# Patient Record
Sex: Male | Born: 1995 | Hispanic: No | Marital: Single | Smoking: Never smoker
Health system: Southern US, Community
[De-identification: ages and names within clinical notes are randomized; demographics above are authoritative.]

---
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
## 2016-10-16 ENCOUNTER — Emergency Department: Payer: Self-pay

## 2016-10-16 ENCOUNTER — Encounter: Payer: Self-pay | Admitting: Emergency Medicine

## 2016-10-16 DIAGNOSIS — Y929 Unspecified place or not applicable: Secondary | ICD-10-CM | POA: Insufficient documentation

## 2016-10-16 DIAGNOSIS — W231XXA Caught, crushed, jammed, or pinched between stationary objects, initial encounter: Secondary | ICD-10-CM | POA: Insufficient documentation

## 2016-10-16 DIAGNOSIS — Y999 Unspecified external cause status: Secondary | ICD-10-CM | POA: Insufficient documentation

## 2016-10-16 DIAGNOSIS — S60111A Contusion of right thumb with damage to nail, initial encounter: Secondary | ICD-10-CM | POA: Insufficient documentation

## 2016-10-16 DIAGNOSIS — Y9389 Activity, other specified: Secondary | ICD-10-CM | POA: Insufficient documentation

## 2016-10-16 NOTE — ED Triage Notes (Signed)
Patient ambulatory to triage with complaints of right thumb pain after slamming thumb in car door at approx 1800, redness, pain and swelling to area. Pt reports taking IBU  at 2300 before coming to ED.  Speaking in complete coherent sentences. No acute breathing distress noted.

## 2016-10-17 ENCOUNTER — Emergency Department
Admission: EM | Admit: 2016-10-17 | Discharge: 2016-10-17 | Disposition: A | Discharge status: Home / Self Care | Payer: Self-pay | Attending: Emergency Medicine | Admitting: Emergency Medicine

## 2016-10-17 DIAGNOSIS — S60111A Contusion of right thumb with damage to nail, initial encounter: Secondary | ICD-10-CM

## 2016-10-17 MED ORDER — BACITRACIN ZINC 500 UNIT/GM EX OINT
TOPICAL_OINTMENT | Freq: Once | CUTANEOUS | Status: AC
  Administered 2016-10-17: 1 via TOPICAL
  Filled 2016-10-17: qty 0.9

## 2016-10-17 MED ORDER — IBUPROFEN 400 MG PO TABS
400.0000 mg | ORAL_TABLET | Freq: Once | ORAL | Status: AC
  Administered 2016-10-17: 400 mg via ORAL
  Filled 2016-10-17: qty 1

## 2016-10-17 MED ORDER — ACETAMINOPHEN 325 MG PO TABS
650.0000 mg | ORAL_TABLET | Freq: Once | ORAL | Status: AC
  Administered 2016-10-17: 650 mg via ORAL
  Filled 2016-10-17: qty 2

## 2016-10-17 NOTE — ED Provider Notes (Signed)
[facility] Emergency Department Provider Note   ____________________________________________   First MD Initiated Contact with Patient 10/17/16 0103     (approximate)  I have reviewed the triage vital signs and the nursing notes.   HISTORY  Chief Complaint Finger Injury    HPI Eric Hart is a 21 y.o. male who comes into the hospital today with some pain to his right thumb. He reports that he's not sure if it's just bruised or broken. He reports he was getting out of the car to pump gas and when he slammed the door his finger was still there. The patient slammed his finger in the door. He took some ibuprofen at home as well as ice to it but it didn't help with the pain. He tried to sleep but the pain was too intense that he decided to come in to the hospital for evaluation. The patient rates his pain a 6-7 out of 10 in intensity. He is able to move it but again reports it is throbbing and pain.   History reviewed. No pertinent past medical history.  There are no active problems to display for this patient.   History reviewed. No pertinent surgical history.  Prior to Admission medications   Not on File    Allergies Patient has no known allergies.  History reviewed. No pertinent family history.  Social History Social History  Substance Use Topics  . Smoking status: Never Smoker  . Smokeless tobacco: Never Used  . Alcohol use No    Review of Systems Constitutional: No fever/chills Eyes: No visual changes. ENT: No sore throat. Cardiovascular: Denies chest pain. Respiratory: Denies shortness of breath. Gastrointestinal: No abdominal pain.  No nausea, no vomiting.  No diarrhea.  No constipation. Genitourinary: Negative for dysuria. Musculoskeletal: Right thumb pain Skin: Negative for rash. Neurological: Negative for headaches, focal weakness or numbness.  10-point ROS otherwise  negative.  ____________________________________________   PHYSICAL EXAM:  VITAL SIGNS: ED Triage Vitals  Enc Vitals Group     BP 10/16/16 2334 123/75     Pulse Rate 10/16/16 2334 75     Resp 10/16/16 2334 18     Temp 10/16/16 2334 98.3 F (36.8 C)     Temp Source 10/16/16 2334 Oral     SpO2 10/16/16 2334 98 %     Weight 10/16/16 2335 120 lb (54.4 kg)     Height 10/16/16 2335  (1.651 m)     Head Circumference --      Peak Flow --      Pain Score 10/16/16 2334 7     Pain Loc --      Pain Edu? --      Excl. in GC? --     Constitutional: Alert and oriented. Well appearing and in Moderate distress. Eyes: Conjunctivae are normal. PERRL. EOMI. Head: Atraumatic. Nose: No congestion/rhinnorhea. Mouth/Throat: Mucous membranes are moist.  Oropharynx non-erythematous. Cardiovascular: Normal rate, regular rhythm. Grossly normal heart sounds.  Good peripheral circulation. Respiratory: Normal respiratory effort.  No retractions. Lungs CTAB. Gastrointestinal: Soft and nontender. No distention. Positive bowel sounds Musculoskeletal: No lower extremity tenderness nor edema.  Neurologic:  Normal speech and language.  Skin:  Skin is warm, dry and intact. Patient has a hematoma to the base of the right thumbnail Psychiatric: Mood and affect are normal.   ____________________________________________   LABS (all labs ordered are listed, but only abnormal results are displayed)  Labs Reviewed - No data to display ____________________________________________  EKG  none ____________________________________________  RADIOLOGY  DG right thumb ____________________________________________   PROCEDURES  Procedure(s) performed: please, see procedure note(s).  .Nail Removal Date/Time: 10/17/2016 1:10 AM Performed by: Rebecka Apley Authorized by: Rebecka Apley   Consent:    Consent obtained:  Verbal Location:    Hand:  R thumb Pre-procedure details:    Skin  preparation:  Alcohol Trephination:    Subungual hematoma drained: yes     Trephination instrument:  Needle Post-procedure details:    Dressing:  4x4 sterile gauze and antibiotic ointment   Patient tolerance of procedure:  Tolerated well, no immediate complications    Critical Care performed: No  ____________________________________________   INITIAL IMPRESSION / ASSESSMENT AND PLAN / ED COURSE  Pertinent labs & imaging results that were available during my care of the patient were reviewed by me and considered in my medical decision making (see chart for details).  This is a 21 year old male who comes into the hospital today after slamming his right thumb into the car door. The patient had an x-ray which did not show a fracture but the patient does have a subungual hematoma. I did trephinate the patient's nail to release the hematoma. I will also give him some Tylenol and ibuprofen. We'll wrap the patient's finger and some gauze and he will be discharged home. I think her symptoms ice it and to follow-up. The patient has no further questions or concerns at this time and he'll be discharged home.  Clinical Course as of Oct 17 137  Mon Oct 17, 2016  0102 Negative. DG Finger Thumb Right [AW]    Clinical Course User Index [AW] Rebecka Apley, MD     ____________________________________________   FINAL CLINICAL IMPRESSION(S) / ED DIAGNOSES  Final diagnoses:  Subungual hematoma of right thumb, initial encounter      NEW MEDICATIONS STARTED DURING THIS VISIT:  New Prescriptions   No medications on file     Note:  This document was prepared using Dragon voice recognition software and may include unintentional dictation errors.    Rebecka Apley, MD 10/17/16 [phone]

## 2017-12-21 IMAGING — CR DG FINGER THUMB 2+V*R*
3 series · 3 of 3 positions shown · non-contrast
Comparison: None.

CLINICAL DATA: Redness, pain, and swelling to the right thumb after
slamming in a car door.

EXAM:
RIGHT THUMB 2+V

[finger ap]
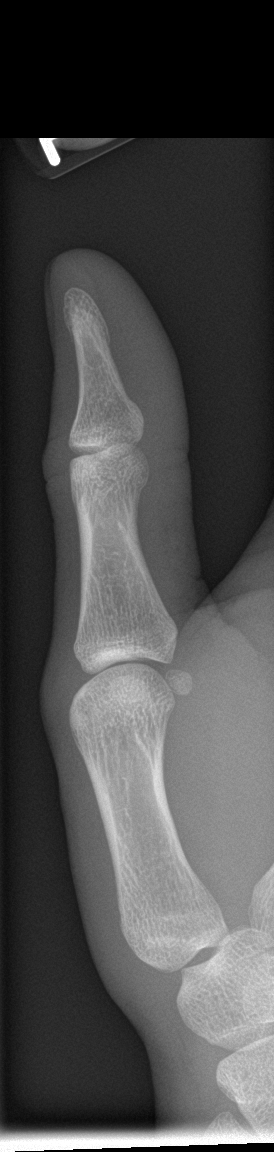

[finger obl]
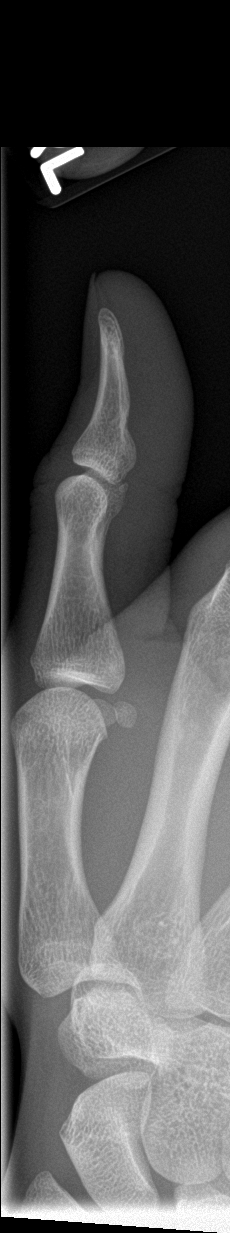

[finger lat]
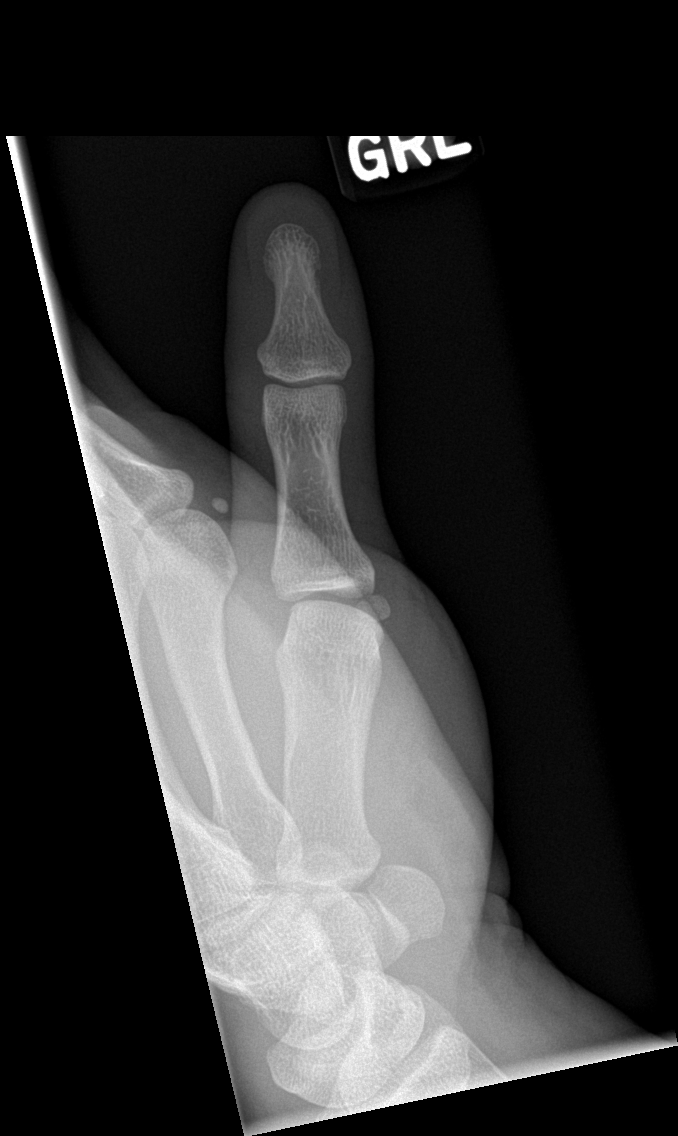

[3 of 3 positions shown; findings below may reference images not displayed]

FINDINGS: There is no evidence of fracture or dislocation. There is no
evidence of arthropathy or other focal bone abnormality. Soft
tissues are unremarkable
IMPRESSION: Negative.

## 2019-02-11 ENCOUNTER — Other Ambulatory Visit: Payer: Self-pay

## 2019-02-11 DIAGNOSIS — Z20822 Contact with and (suspected) exposure to covid-19: Secondary | ICD-10-CM

## 2019-02-12 LAB — NOVEL CORONAVIRUS, NAA: SARS-CoV-2, NAA: DETECTED — AB
